# Patient Record
Sex: Male | Born: 1990 | Race: Asian | Hispanic: No | Marital: Single | Smoking: Current every day smoker
Health system: Southern US, Community
[De-identification: ages and names within clinical notes are randomized; demographics above are authoritative.]

---
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
## 2019-07-28 ENCOUNTER — Ambulatory Visit (HOSPITAL_COMMUNITY)
Admission: EM | Admit: 2019-07-28 | Discharge: 2019-07-28 | Disposition: A | Discharge status: Home / Self Care | Payer: Self-pay

## 2019-07-28 ENCOUNTER — Other Ambulatory Visit: Payer: Self-pay

## 2019-07-28 ENCOUNTER — Encounter (HOSPITAL_COMMUNITY): Payer: Self-pay

## 2019-07-28 DIAGNOSIS — M542 Cervicalgia: Secondary | ICD-10-CM
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
## 2023-12-14 ENCOUNTER — Encounter (HOSPITAL_COMMUNITY): Payer: Self-pay

## 2023-12-14 ENCOUNTER — Ambulatory Visit (HOSPITAL_COMMUNITY)
Admission: EM | Admit: 2023-12-14 | Discharge: 2023-12-14 | Disposition: A | Discharge status: Home / Self Care | Payer: Self-pay

## 2023-12-14 DIAGNOSIS — B029 Zoster without complications: Secondary | ICD-10-CM

## 2023-12-14 MED ORDER — VALACYCLOVIR HCL 1 G PO TABS: 1000.0000 mg | ORAL_TABLET | Freq: Three times a day (TID) | ORAL | 0 refills | Status: AC

## 2023-12-14 NOTE — ED Provider Notes (Signed)
 [facility]  Note:  This document was prepared using Dragon voice recognition software and may include unintentional dictation errors.  MRN: [id number] DOB: Dec 12, 1990  Subjective:   Kirk Moody is a 33 y.o. male presenting for erythematous, painful rash over right flank and right back x 7 days.  Patient reports that rash is starting to scab up but seems to be spreading from the right flank to the right mid back.  Rash does not cross midline appears in a moderately linear pattern patient has been using over-the-counter hydrocortisone anterocervical ointment to treat rash with mild improvement.  Patient reports that rash is scabbing up over his right flank but is still experiencing burning pain to the area.  Patient does not recall if he had chickenpox as a child and denies any known exposure to anyone with shingles.  No current facility-administered medications for this encounter.  Current Outpatient Medications:    valACYclovir (VALTREX) 1000 MG tablet, Take 1 tablet (1,000 mg total) by mouth 3 (three) times daily., Disp: 21 tablet, Rfl: 0   No Known Allergies  History reviewed. No pertinent past medical history.   History reviewed. No pertinent surgical history.  Family History  Problem Relation Age of Onset   Healthy Mother    Diabetes Father     Social History   Tobacco Use   Smoking status: Every Day    Current packs/day: 1.00    Types: Cigarettes   Smokeless tobacco: Never  Vaping Use   Vaping status: Never Used  Substance Use Topics   Alcohol use: Yes   Drug use: Never    ROS Refer to HPI for ROS details.  Objective:   Vitals: BP (!) 154/92 (BP Location: Left Arm)   Pulse (!) 107   Temp 98.7 F (37.1 C) (Oral)   Resp 18   Ht 5' 5 (1.651 m)   Wt 180 lb (81.6 kg)   SpO2 98%   BMI 29.95 kg/m   Physical Exam Vitals and nursing note reviewed.  Constitutional:      General: He is not in acute distress.    Appearance: Normal appearance.  He is well-developed. He is not ill-appearing or toxic-appearing.  HENT:     Head: Normocephalic.   Cardiovascular:     Rate and Rhythm: Normal rate.  Pulmonary:     Effort: Pulmonary effort is normal. No respiratory distress.   Skin:    General: Skin is warm and dry.     Findings: Erythema and rash present. Rash is crusting and vesicular. Rash is not papular.   Neurological:     General: No focal deficit present.     Mental Status: He is alert and oriented to person, place, and time.   Psychiatric:        Mood and Affect: Mood normal.        Behavior: Behavior normal.     Procedures  No results found for this or any previous visit (from the past 24 hours).  No results found.   Assessment and Plan :     Discharge Instructions       1. Herpes zoster without complication (Primary) - valACYclovir (VALTREX) 1000 MG tablet; Take 1 tablet (1,000 mg total) by mouth 3 (three) times daily.  Dispense: 21 tablet; Refill: 0 - Take ibuprofen 800 mg every 8 hours as needed for pain and inflammation secondary to shingles infection. -Continue using over-the-counter topical treatment as you have been to help with shingles rash. -Continue to monitor symptoms  for any change in severity if there is any escalation of current symptoms or development of new symptoms follow-up in ER for further evaluation and management.      Keisa Blow B [place]   Ziaire Hagos, [place], [place] 12/14/23 1659

## 2023-12-14 NOTE — ED Triage Notes (Signed)
 Patient presenting with rash on the right hip onset 7 days ago. Area is red, scabbing, and spreading.   Prescriptions or OTC medications tried: Yes- Ibuprofen, Terasil ointment, cortisone ointment, Benadryl, Tylenol.    with mild relief.

## 2023-12-14 NOTE — Discharge Instructions (Addendum)
  1. Herpes zoster without complication (Primary) - valACYclovir (VALTREX) 1000 MG tablet; Take 1 tablet (1,000 mg total) by mouth 3 (three) times daily.  Dispense: 21 tablet; Refill: 0 - Take ibuprofen 800 mg every 8 hours as needed for pain and inflammation secondary to shingles infection. -Continue using over-the-counter topical treatment as you have been to help with shingles rash. -Continue to monitor symptoms for any change in severity if there is any escalation of current symptoms or development of new symptoms follow-up in ER for further evaluation and management.
